# Patient Record
Sex: Male | Born: 1985 | Race: Black or African American | Hispanic: No | Marital: Single | Smoking: Current every day smoker
Health system: Southern US, Community
[De-identification: ages and names within clinical notes are randomized; demographics above are authoritative.]

---

## 2016-04-28 ENCOUNTER — Emergency Department (HOSPITAL_COMMUNITY)
Admission: EM | Admit: 2016-04-28 | Discharge: 2016-04-28 | Disposition: A | Discharge status: Home / Self Care | Payer: No Typology Code available for payment source | Attending: Emergency Medicine | Admitting: Emergency Medicine

## 2016-04-28 ENCOUNTER — Emergency Department (HOSPITAL_COMMUNITY): Payer: No Typology Code available for payment source

## 2016-04-28 ENCOUNTER — Encounter (HOSPITAL_COMMUNITY): Payer: Self-pay | Admitting: Nurse Practitioner

## 2016-04-28 DIAGNOSIS — F17210 Nicotine dependence, cigarettes, uncomplicated: Secondary | ICD-10-CM | POA: Insufficient documentation

## 2016-04-28 DIAGNOSIS — Y999 Unspecified external cause status: Secondary | ICD-10-CM | POA: Insufficient documentation

## 2016-04-28 DIAGNOSIS — Y92410 Unspecified street and highway as the place of occurrence of the external cause: Secondary | ICD-10-CM | POA: Diagnosis not present

## 2016-04-28 DIAGNOSIS — Y939 Activity, unspecified: Secondary | ICD-10-CM | POA: Diagnosis not present

## 2016-04-28 DIAGNOSIS — M542 Cervicalgia: Secondary | ICD-10-CM | POA: Diagnosis not present

## 2016-04-28 DIAGNOSIS — R0789 Other chest pain: Secondary | ICD-10-CM | POA: Diagnosis not present

## 2016-04-28 MED ORDER — CYCLOBENZAPRINE HCL 10 MG PO TABS
5.0000 mg | ORAL_TABLET | Freq: Once | ORAL | Status: AC
  Administered 2016-04-28: 5 mg via ORAL
  Filled 2016-04-28: qty 1

## 2016-04-28 MED ORDER — CYCLOBENZAPRINE HCL 10 MG PO TABS: 10.0000 mg | ORAL_TABLET | Freq: Two times a day (BID) | ORAL | 0 refills | Status: AC | PRN

## 2016-04-28 MED ORDER — IBUPROFEN 800 MG PO TABS: 800.0000 mg | ORAL_TABLET | Freq: Three times a day (TID) | ORAL | 0 refills | Status: AC

## 2016-04-28 MED ORDER — IBUPROFEN 800 MG PO TABS
800.0000 mg | ORAL_TABLET | Freq: Once | ORAL | Status: AC
  Administered 2016-04-28: 800 mg via ORAL
  Filled 2016-04-28: qty 1
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
